# Patient Record
Sex: Male | Born: 1995 | Race: Black or African American | Hispanic: No | Marital: Single | Smoking: Never smoker
Health system: Southern US, Community
[De-identification: ages and names within clinical notes are randomized; demographics above are authoritative.]

---

## 2017-10-08 ENCOUNTER — Ambulatory Visit (INDEPENDENT_AMBULATORY_CARE_PROVIDER_SITE_OTHER): Payer: BLUE CROSS/BLUE SHIELD | Admitting: Podiatry

## 2017-10-08 ENCOUNTER — Encounter: Payer: Self-pay | Admitting: Podiatry

## 2017-10-08 DIAGNOSIS — B070 Plantar wart: Secondary | ICD-10-CM | POA: Diagnosis not present

## 2017-10-08 NOTE — Progress Notes (Signed)
Subjective:   Patient ID: Brad Murillo, male   DOB: 22 y.o.   MRN: [id number]   HPI Patient presents with painful lesion on the right hallux that is been present for several months.  Does not remember specific injury and states it hard to bear weight on.  Patient does not smoke likes to be active and played basketball   Review of Systems  All other systems reviewed and are negative.       Objective:  Physical Exam  Constitutional: He appears well-developed and well-nourished.  Cardiovascular: Intact distal pulses.  Pulmonary/Chest: Effort normal.  Musculoskeletal: Normal range of motion.  Neurological: He is alert.  Skin: Skin is warm.  Nursing note and vitals reviewed.   Neurovascular status intact muscle strength is adequate range of motion within normal limits with patient found to have a small lesion plantar aspect right hallux that upon debridement shows pinpoint bleeding and pain to lateral pressure.  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  Patient then had chemical application of immune agents sterile dressing and will reappoint if symptoms persist over the next 4-6 weeks and may ultimately require excision of lesion

## 2017-10-08 NOTE — Progress Notes (Signed)
   Subjective:    Patient ID: Brad Murillo, male    DOB: 02/10/1996, 22 y.o.   MRN: [id number]  HPI    Review of Systems  All other systems reviewed and are negative.      Objective:   Physical Exam        Assessment & Plan:

## 2017-11-25 ENCOUNTER — Other Ambulatory Visit: Payer: Self-pay | Admitting: Cardiology

## 2017-11-25 DIAGNOSIS — R079 Chest pain, unspecified: Secondary | ICD-10-CM

## 2017-12-02 ENCOUNTER — Encounter (HOSPITAL_COMMUNITY)
Admission: RE | Admit: 2017-12-02 | Discharge: 2017-12-02 | Disposition: A | Discharge status: Home / Self Care | Payer: BLUE CROSS/BLUE SHIELD | Source: Ambulatory Visit | Attending: Cardiology | Admitting: Cardiology

## 2017-12-02 DIAGNOSIS — R079 Chest pain, unspecified: Secondary | ICD-10-CM | POA: Insufficient documentation

## 2017-12-02 MED ORDER — TECHNETIUM TC 99M TETROFOSMIN IV KIT
30.0000 | PACK | Freq: Once | INTRAVENOUS | Status: AC | PRN
  Administered 2017-12-02: 30 via INTRAVENOUS

## 2017-12-02 MED ORDER — TECHNETIUM TC 99M TETROFOSMIN IV KIT
10.0000 | PACK | Freq: Once | INTRAVENOUS | Status: AC | PRN
  Administered 2017-12-02: 10 via INTRAVENOUS
